# Patient Record
Sex: Female | Born: 1975 | Hispanic: Yes | Marital: Married | Smoking: Never smoker
Health system: Southern US, Community
[De-identification: ages and names within clinical notes are randomized; demographics above are authoritative.]

---

## 2010-02-20 ENCOUNTER — Ambulatory Visit: Payer: Self-pay | Admitting: Family Medicine

## 2010-03-15 ENCOUNTER — Ambulatory Visit: Payer: Self-pay | Admitting: Obstetrics and Gynecology

## 2014-02-05 ENCOUNTER — Inpatient Hospital Stay: Payer: Self-pay | Admitting: Specialist

## 2014-02-05 LAB — CBC WITH DIFFERENTIAL/PLATELET
BASOS PCT: 0.5 %
Basophil #: 0 10*3/uL (ref 0.0–0.1)
EOS ABS: 0.1 10*3/uL (ref 0.0–0.7)
EOS PCT: 1.1 %
HCT: 42.4 % (ref 35.0–47.0)
HGB: 13.5 g/dL (ref 12.0–16.0)
Lymphocyte #: 2.9 10*3/uL (ref 1.0–3.6)
Lymphocyte %: 31 %
MCH: 28.4 pg (ref 26.0–34.0)
MCHC: 31.8 g/dL — AB (ref 32.0–36.0)
MCV: 89 fL (ref 80–100)
MONO ABS: 0.5 x10 3/mm (ref 0.2–0.9)
MONOS PCT: 5.5 %
Neutrophil #: 5.7 10*3/uL (ref 1.4–6.5)
Neutrophil %: 61.9 %
Platelet: 170 10*3/uL (ref 150–440)
RBC: 4.74 10*6/uL (ref 3.80–5.20)
RDW: 13 % (ref 11.5–14.5)
WBC: 9.3 10*3/uL (ref 3.6–11.0)

## 2014-02-05 LAB — FIBRIN DEGRADATION PROD.(ARMC ONLY): Fibrin Degradation Prod.: <10 ug/ml (ref 2.1–7.7)

## 2014-02-05 LAB — BASIC METABOLIC PANEL
ANION GAP: 8 (ref 7–16)
BUN: 12 mg/dL (ref 7–18)
CHLORIDE: 109 mmol/L — AB (ref 98–107)
Calcium, Total: 8.4 mg/dL — ABNORMAL LOW (ref 8.5–10.1)
Co2: 25 mmol/L (ref 21–32)
Creatinine: 0.6 mg/dL (ref 0.60–1.30)
EGFR (African American): >60
GLUCOSE: 110 mg/dL — AB (ref 65–99)
OSMOLALITY: 284 (ref 275–301)
Potassium: 3.4 mmol/L — ABNORMAL LOW (ref 3.5–5.1)
Sodium: 142 mmol/L (ref 136–145)

## 2014-02-05 LAB — PROTIME-INR
INR: 1.9
Prothrombin Time: 21.5 secs — ABNORMAL HIGH (ref 11.5–14.7)

## 2014-02-05 LAB — APTT: Activated PTT: 35.9 secs (ref 23.6–35.9)

## 2014-02-06 LAB — BASIC METABOLIC PANEL
Anion Gap: 4 — ABNORMAL LOW (ref 7–16)
BUN: 11 mg/dL (ref 7–18)
CREATININE: 0.45 mg/dL — AB (ref 0.60–1.30)
Calcium, Total: 7.1 mg/dL — ABNORMAL LOW (ref 8.5–10.1)
Chloride: 116 mmol/L — ABNORMAL HIGH (ref 98–107)
Co2: 23 mmol/L (ref 21–32)
EGFR (African American): >60
EGFR (Non-African Amer.): >60
Glucose: 128 mg/dL — ABNORMAL HIGH (ref 65–99)
Osmolality: 286 (ref 275–301)
POTASSIUM: 4 mmol/L (ref 3.5–5.1)
Sodium: 143 mmol/L (ref 136–145)

## 2014-02-06 LAB — FIBRINOGEN: Fibrinogen: 237 mg/dL (ref 210–470)

## 2014-02-06 LAB — CBC WITH DIFFERENTIAL/PLATELET
BASOS ABS: 0 10*3/uL (ref 0.0–0.1)
Basophil #: 0 10*3/uL (ref 0.0–0.1)
Basophil %: 0.5 %
Basophil %: 0.4 %
EOS ABS: 0.1 10*3/uL (ref 0.0–0.7)
Eosinophil #: 0 10*3/uL (ref 0.0–0.7)
Eosinophil %: 0.5 %
Eosinophil %: 1.1 %
HCT: 33.9 % — AB (ref 35.0–47.0)
HCT: 35.9 % (ref 35.0–47.0)
HGB: 11.1 g/dL — ABNORMAL LOW (ref 12.0–16.0)
HGB: 11.6 g/dL — ABNORMAL LOW (ref 12.0–16.0)
Lymphocyte #: 1.3 10*3/uL (ref 1.0–3.6)
Lymphocyte #: 2.2 10*3/uL (ref 1.0–3.6)
Lymphocyte %: 19.3 %
Lymphocyte %: 31.5 %
MCH: 29.8 pg (ref 26.0–34.0)
MCH: 29.2 pg (ref 26.0–34.0)
MCHC: 32.8 g/dL (ref 32.0–36.0)
MCHC: 32.3 g/dL (ref 32.0–36.0)
MCV: 91 fL (ref 80–100)
MCV: 90 fL (ref 80–100)
MONO ABS: 0.4 x10 3/mm (ref 0.2–0.9)
MONOS PCT: 4.4 %
Monocyte #: 0.3 x10 3/mm (ref 0.2–0.9)
Monocyte %: 6.5 %
NEUTROS PCT: 75.3 %
NEUTROS PCT: 60.5 %
Neutrophil #: 5 10*3/uL (ref 1.4–6.5)
Neutrophil #: 4.1 10*3/uL (ref 1.4–6.5)
Platelet: 145 10*3/uL — ABNORMAL LOW (ref 150–440)
Platelet: 138 10*3/uL — ABNORMAL LOW (ref 150–440)
RBC: 3.72 10*6/uL — AB (ref 3.80–5.20)
RBC: 3.97 10*6/uL (ref 3.80–5.20)
RDW: 13.3 % (ref 11.5–14.5)
RDW: 13.4 % (ref 11.5–14.5)
WBC: 6.6 10*3/uL (ref 3.6–11.0)
WBC: 6.8 10*3/uL (ref 3.6–11.0)

## 2014-02-06 LAB — PROTIME-INR
INR: 1.2
Prothrombin Time: 14.8 secs — ABNORMAL HIGH (ref 11.5–14.7)

## 2014-02-07 LAB — CBC WITH DIFFERENTIAL/PLATELET
BASOS ABS: 0 10*3/uL (ref 0.0–0.1)
Basophil %: 0.7 %
EOS ABS: 0.1 10*3/uL (ref 0.0–0.7)
Eosinophil %: 1.6 %
HCT: 37.3 % (ref 35.0–47.0)
HGB: 12.3 g/dL (ref 12.0–16.0)
Lymphocyte #: 2.3 10*3/uL (ref 1.0–3.6)
Lymphocyte %: 37.7 %
MCH: 29.7 pg (ref 26.0–34.0)
MCHC: 32.9 g/dL (ref 32.0–36.0)
MCV: 90 fL (ref 80–100)
MONOS PCT: 5.7 %
Monocyte #: 0.3 x10 3/mm (ref 0.2–0.9)
NEUTROS ABS: 3.3 10*3/uL (ref 1.4–6.5)
Neutrophil %: 54.3 %
Platelet: 146 10*3/uL — ABNORMAL LOW (ref 150–440)
RBC: 4.14 10*6/uL (ref 3.80–5.20)
RDW: 13.1 % (ref 11.5–14.5)
WBC: 6 10*3/uL (ref 3.6–11.0)

## 2014-02-07 LAB — BASIC METABOLIC PANEL
Anion Gap: 6 — ABNORMAL LOW (ref 7–16)
BUN: 4 mg/dL — ABNORMAL LOW (ref 7–18)
Calcium, Total: 7.5 mg/dL — ABNORMAL LOW (ref 8.5–10.1)
Chloride: 114 mmol/L — ABNORMAL HIGH (ref 98–107)
Co2: 24 mmol/L (ref 21–32)
Creatinine: 0.45 mg/dL — ABNORMAL LOW (ref 0.60–1.30)
EGFR (African American): >60
Glucose: 84 mg/dL (ref 65–99)
Osmolality: 283 (ref 275–301)
POTASSIUM: 3.9 mmol/L (ref 3.5–5.1)
Sodium: 144 mmol/L (ref 136–145)

## 2014-08-30 NOTE — Discharge Summary (Signed)
PATIENT NAME:  Amanda Greene, Amanda Greene MR#:  [id number] DATE OF BIRTH:  June 05, 1975  DATE OF ADMISSION:  02/05/2014 DATE OF DISCHARGE:  02/08/2014   For a detailed note, please take a look at the history and physical done on admission by Dr. Luberta MutterKonidena.   DIAGNOSES AT DISCHARGE: Copperhead snake bite; localized inflammation secondary to the snake bite.   DIET: The patient is being discharged on a regular diet.   ACTIVITY: As tolerated.   FOLLOWUP: With Dr. Hillery AldoSarah Patel in the next 1-2 weeks.   DISCHARGE MEDICATIONS: Ibuprofen 600 mg 4 times daily as needed.   PERTINENT STUDIES DONE DURING THE HOSPITAL COURSE: None.   BRIEF HOSPITAL COURSE: This is a [age] year old female who presented to the hospital due to a copperhead snake bite to her right foot.  The patient had significant localized inflammation after the snakebite. She has some swelling, redness and inflammation tracking upper her right lower extremity. When she presented, her liver function tests and coagulopathy were fairly normal, although she was still given antivenom. She received 2 doses antivenom while in the hospital. Her pain and localized swelling and redness have since then significantly improved. She still has some trouble bearing some weight on that leg. She is being discharged with some crutches and some ibuprofen as needed for pain.   CODE STATUS: The patient is a full code.   TIME SPENT ON DISCHARGE: 35 minutes    ____________________________ Rolly PancakeVivek J. Cherlynn KaiserSainani, MD vjs:MT D: 02/08/2014 13:48:43 ET T: 02/09/2014 06:26:32 ET JOB#: [id number]  cc: Rolly PancakeVivek J. Cherlynn KaiserSainani, MD, <Dictator> Sarah "Sallie" Allena KatzPatel, MD [place] SirenVIVEK J SAINANI MD ELECTRONICALLY SIGNED 02/18/2014 14:59

## 2014-08-30 NOTE — H&P (Signed)
PATIENT NAME:  Amanda Greene, Amanda Greene MR#:  [id number] DATE OF BIRTH:  Nov 13, 1975  DATE OF ADMISSION:  02/05/2014  CHIEF COMPLAINT: Snake bite.   HISTORY OF PRESENT ILLNESS: A 39 year old female with no past medical history, had a snake bite this evening. The patient had a copperhead snakebite on the right ankle area, also associated with redness, swelling and pain. The patient came to the Emergency Room immediately. Does not have any other complaints at this time. No nausea. No vomiting. No rash. No diarrhea. No chest pain. No trouble breathing. No swelling of the lips. The patient does not have any dizziness, no paresthesias. Mental status, alert and oriented at this time   PAST MEDICAL HISTORY: No hypertension or diabetes.   ALLERGIES: No known allergies.   SOCIAL HISTORY: No smoking, no drinking,   PAST SURGICAL HISTORY: None.   MEDICATIONS: None.   FAMILY HISTORY: No hypertension or diabetes.   REVIEW OF SYSTEMS:  CONSTITUTIONAL: No fatigue.  EYES: No blurred vision.  EARS, NOSE AND THROAT: No tinnitus. No epistaxis. No difficulty swallowing. RESPIRATORY: No cough. No trouble breathing.  CARDIOVASCULAR: No chest pain, orthopnea. No PND.  GASTROINTESTINAL: No nausea. No vomiting. No abdominal pain.  GENITOURINARY: No dysuria.  ENDOCRINE: No polyuria or nocturia.  HEMATOLOGIC: No anemia or easy bruising.  SKIN: Does have a spot of redness, tenderness and bite mark on the left ankle area.  NEUROLOGIC: No numbness or weakness. No tremors. No paresthesia.  PSYCHIATRIC: Anxiety and insomnia.   PHYSICAL EXAMINATION:  VITAL SIGNS: Temperature 97.7, heart rate is 93, blood pressure 130/90, saturation 99% on room air. GENERAL: Alert, awake, oriented. The patient is in distress because of the pain due to snake bite.  HEAD: Normocephalic, atraumatic.  EYES: Pupils equal, reacting to light. Extraocular movements are intact.  EARS, NOSE AND THROAT: No tympanic membrane congestion. No  turbinate hypertrophy. No oropharyngeal erythema.  NECK: Supple. No JVD. No carotid bruit. LUNGS: Respirations clear to auscultation. No wheezes, no rales. CARDIOVASCULAR: S1, S2 regular. No murmurs. PMI not displaced. No peripheral edema.  GASTROINTESTINAL: Abdomen is soft, nontender, nondistended. Bowel sounds present.  MUSCULOSKELETAL: The patient is able to move all extremities and tenderness, redness and area of bite mark present on the right ankle area. NEUROLOGIC: Cranial nerves II-XII are intact. Power 5/5 in upper and lower extremities. Sensation intact. DTRs 2+ bilaterally.  PSYCHIATRIC: Mood and affect are within normal limits.   LABORATORY DATA: White count 9.3, hemoglobin 13.5, hematocrit 42.4, platelets 170,000. Electrolytes: Sodium 142, potassium 3.4, chloride 109, bicarbonate 25, BUN 12, creatinine 0.6, glucose 110. The patient's INR is 1.9, activated PTT 35.9. The patient's EKG, EKG was not done in the ER.   ASSESSMENT AND PLAN: The patient is a 39 year old female with copperhead snake bite. The patient does have swelling, redness and pain of the snake bite area. The patient received 4 vials of antivenom in the Emergency Room and iv  fluid were  already given. She received tetanus toxoid injection and also morphine was given for pain control. She received 8 mg of morphine so far. Admit her to medical service and the patient at this time is hemodynamically stable, and she will get pain control with Dilaudid 2 mg every 4 hours along with IV fluids and recheck her coagulation panel with (LFS , platelets and INR in 3 hours. I have elevated the leg. The patient's condition was discussed with [facility], they recommended that the patient's need for further dose of antivenom only if  her swelling is worse or the pain is worse. At that time, she can get 2 vials of antivenom. If the pain is better and the swelling gets better, the patient does not need anymore of antivenom, and they also  recommended complete bedrest. We will check CK tomorrow morning and also look for other signs of rhabdomyolysis. If the patient complains of a lot of pain, look for any compartment syndrome. The patient's condition at this time is stable and we will admit her to telemetry at this time.   The patient needs swelling,compartmeasurements  measurements every hour for 3 hours and thereafter every 2-4 hours at the site. [facility] she needs additional Cro[employer], that is antivenom, based on swelling and pain.  TIME SPENT: Sixty minutes.   ____________________________ Katha HammingSnehalatha Searcy Miyoshi, MD sk:TT D: 02/05/2014 19:35:46 ET T: 02/05/2014 20:30:38 ET JOB#: [id number]  cc: Katha HammingSnehalatha Alen Matheson, MD, <Dictator> Katha HammingSNEHALATHA Dalayza Zambrana MD ELECTRONICALLY SIGNED 03/01/2014 18:43

## 2020-06-30 ENCOUNTER — Ambulatory Visit
Admission: RE | Admit: 2020-06-30 | Discharge: 2020-06-30 | Disposition: A | Discharge status: Home / Self Care | Payer: Self-pay | Source: Ambulatory Visit | Attending: Oncology | Admitting: Oncology

## 2020-06-30 ENCOUNTER — Ambulatory Visit: Payer: Self-pay | Attending: Oncology

## 2020-06-30 ENCOUNTER — Other Ambulatory Visit: Payer: Self-pay

## 2020-06-30 VITALS — BP 110/65 | HR 57 | Temp 98.9°F | Ht 59.0 in | Wt 122.2 lb

## 2020-06-30 DIAGNOSIS — Z0000 Encounter for general adult medical examination without abnormal findings: Secondary | ICD-10-CM | POA: Insufficient documentation

## 2020-06-30 NOTE — Progress Notes (Signed)
  Subjective:     Patient ID: Amanda Greene, female   DOB: [birth year], 45 y.o.   MRN: [id number]  HPI   Review of Systems     Objective:   Physical Exam Chest:  Breasts:     Right: No swelling, bleeding, inverted nipple, mass, nipple discharge, skin change or tenderness.     Left: No swelling, bleeding, inverted nipple, mass, nipple discharge, skin change or tenderness.          Assessment:     45 year old Hispanic patient presents for [facility] clinic visit.  Patient screened, and meets [facility] eligibility.  Patient does not have insurance, Medicare or Medicaid.  Instructed patient on breast self awareness using teach back method.  Clinical breast exam unremarkable.  No mass or lump palpated.  Delos Haring interpreted exam.  Risk Assessment    Risk Scores      06/30/2020   Last edited by: Jim Like, RN   5-year risk: 0.4 %   Lifetime risk: 6.9 %            Plan:     Sent for bilateral screening mammogram.

## 2020-09-25 NOTE — Progress Notes (Signed)
Letter mailed from [facility] to notify of normal mammogram results.  Patient to return in one year for annual screening.  Copy to [facility]. 

## 2021-12-16 IMAGING — MG MM DIGITAL SCREENING BILAT W/ TOMO AND CAD
6 of 10 series · 6 of 30 positions shown · non-contrast
Comparison: None.

CLINICAL DATA: Screening.

EXAM:
DIGITAL SCREENING BILATERAL MAMMOGRAM WITH TOMOSYNTHESIS AND CAD
TECHNIQUE: Bilateral screening digital craniocaudal and mediolateral oblique
mammograms were obtained. Bilateral screening digital breast
tomosynthesis was performed. The images were evaluated with
computer-aided detection.

[R CC synth-2D]
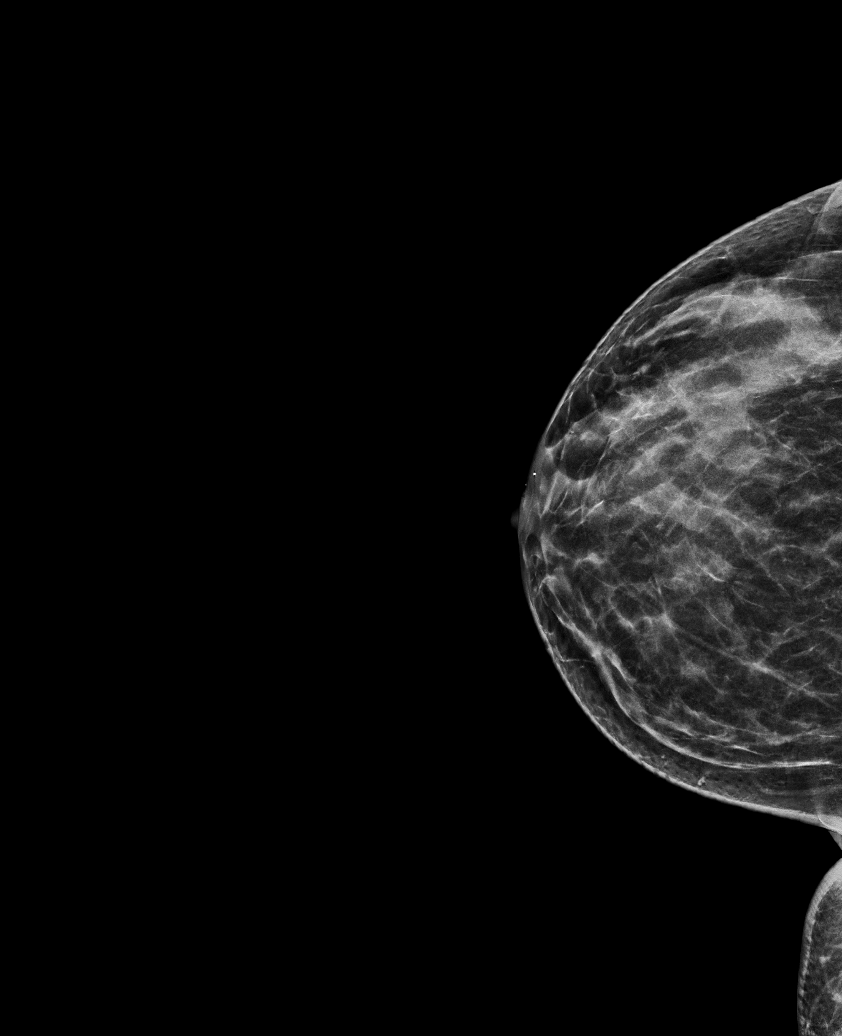

[R MLO synth-2D]
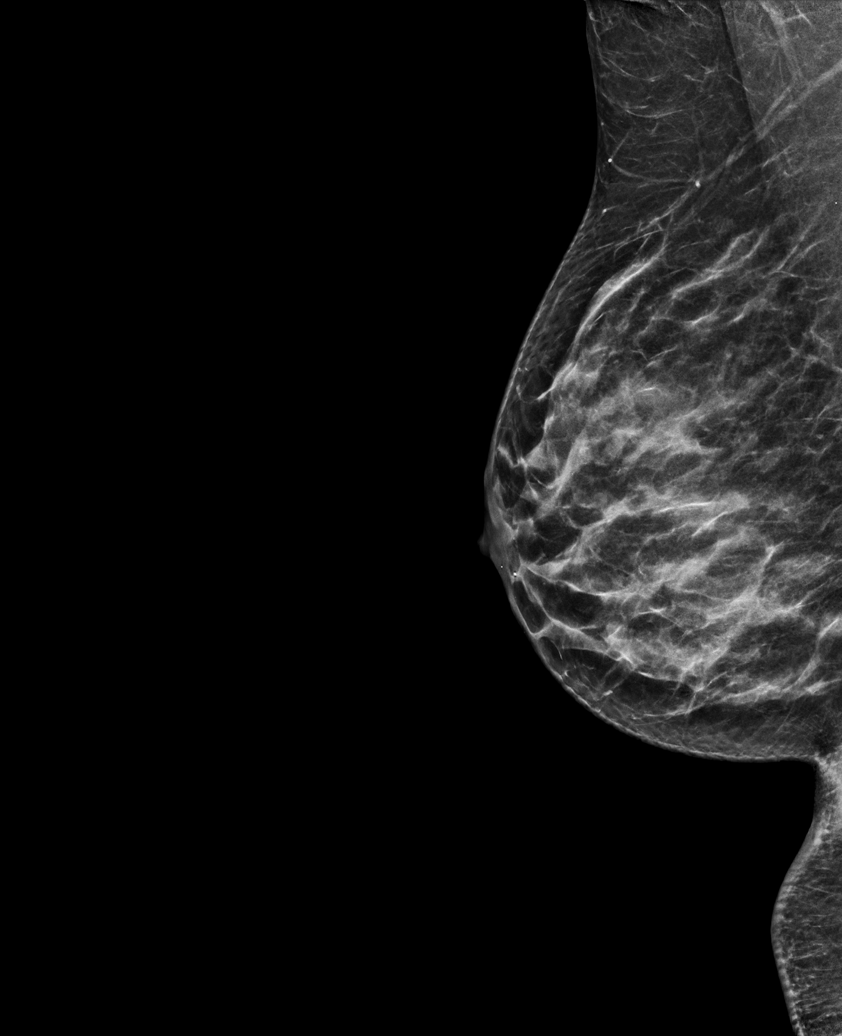

[L CC synth-2D (1 of 2)]
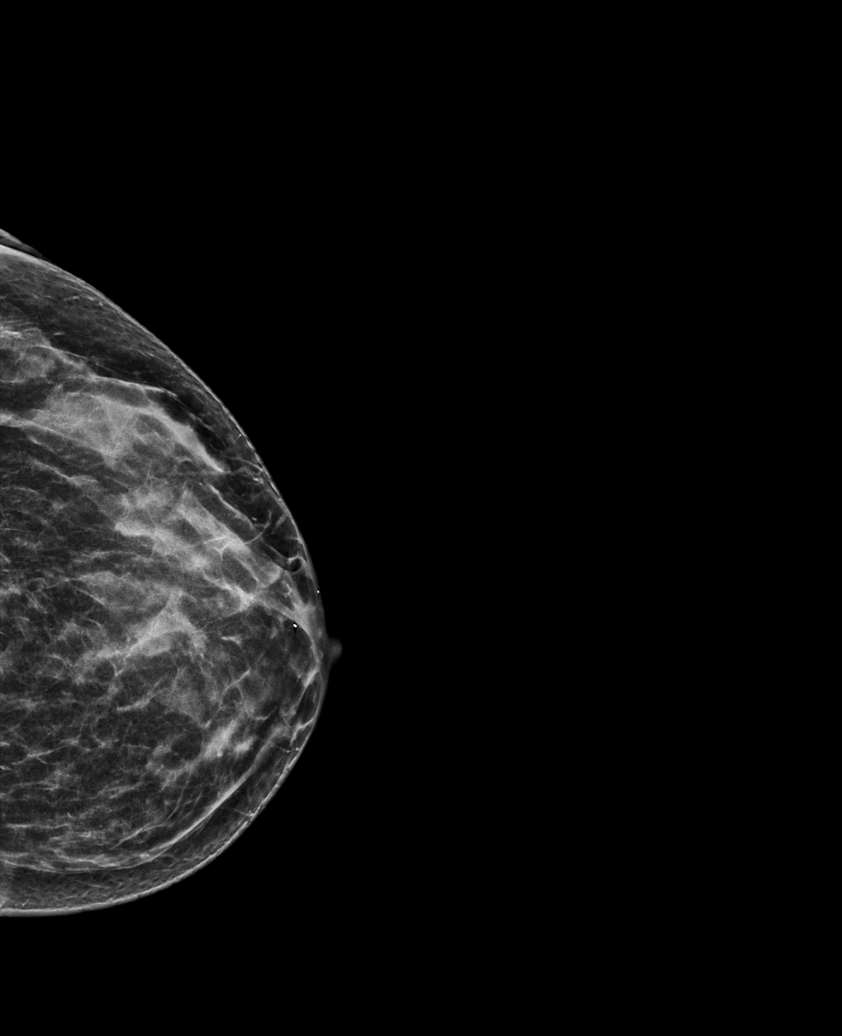

[L CC synth-2D (2 of 2)]
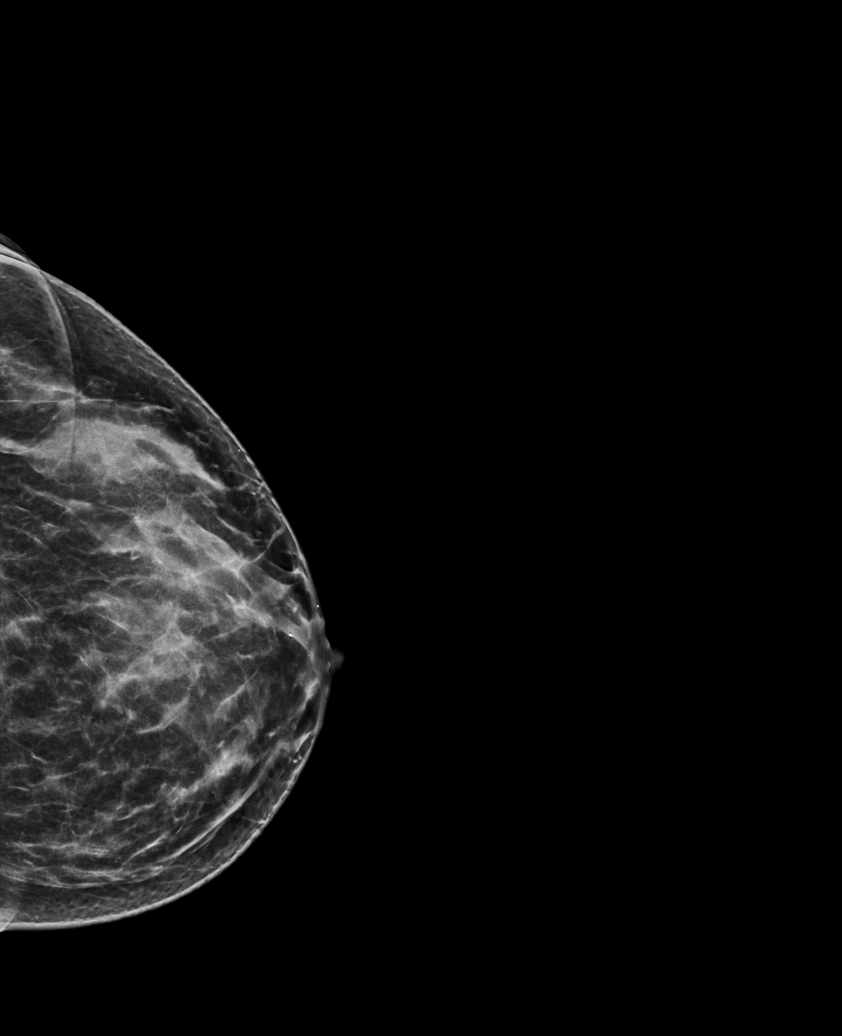

[L MLO synth-2D]
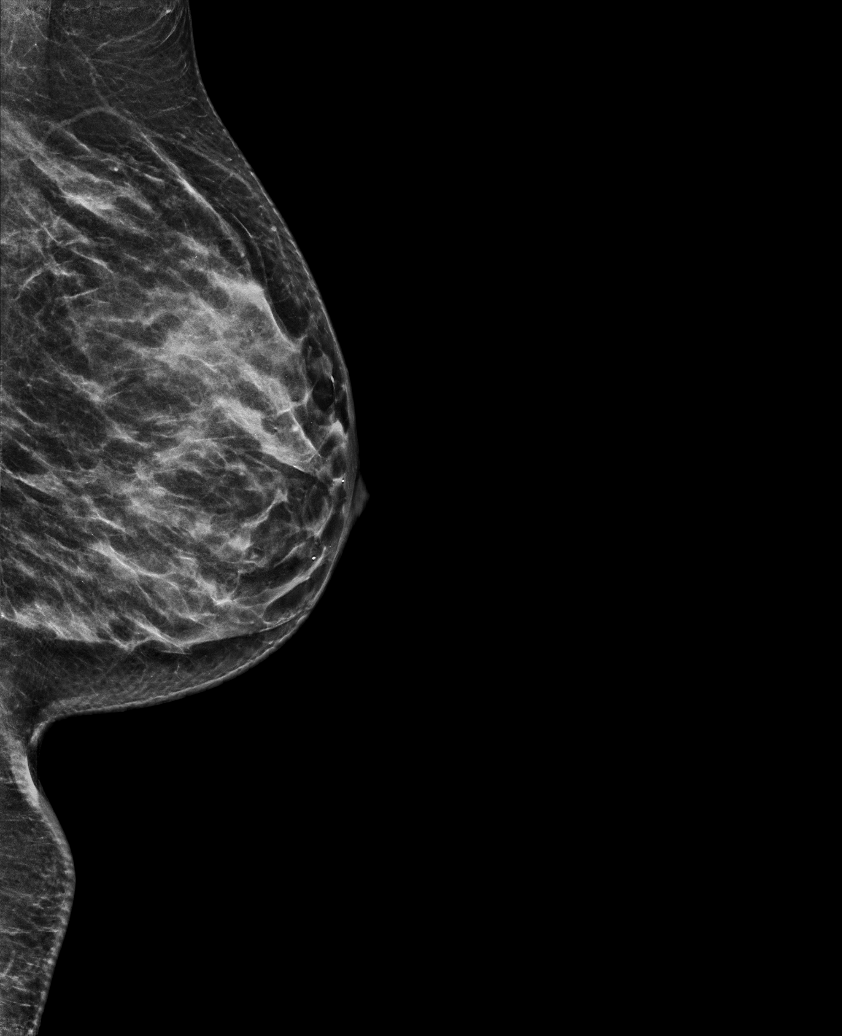

[R CC tomo · tomo slice 26/51.0]
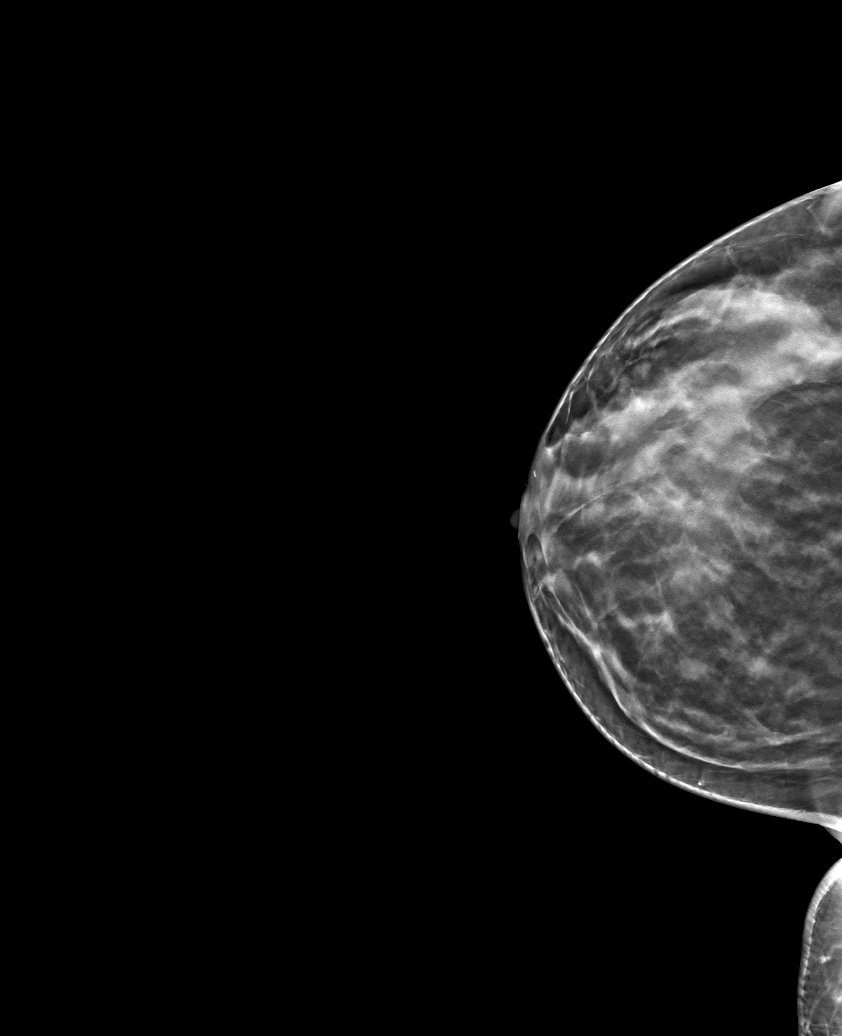

[6 of 30 positions shown; findings below may reference images not displayed]

Baseline exam.

ACR Breast Density Category c: The breast tissue is heterogeneously
dense, which may obscure small masses
FINDINGS: There are no findings suspicious for malignancy.
IMPRESSION: No mammographic evidence of malignancy. A result letter of this
screening mammogram will be mailed directly to the patient.

RECOMMENDATION:
Screening mammogram in one year. (Code:TH-S-B00)

BI-RADS CATEGORY  1: Negative.

## 2022-05-25 ENCOUNTER — Encounter: Payer: Self-pay | Admitting: Family Medicine

## 2022-06-01 ENCOUNTER — Other Ambulatory Visit: Payer: Self-pay

## 2022-06-01 DIAGNOSIS — Z1231 Encounter for screening mammogram for malignant neoplasm of breast: Secondary | ICD-10-CM

## 2022-07-04 ENCOUNTER — Ambulatory Visit
Admission: RE | Admit: 2022-07-04 | Discharge: 2022-07-04 | Disposition: A | Discharge status: Home / Self Care | Payer: Self-pay | Source: Ambulatory Visit | Attending: Obstetrics and Gynecology | Admitting: Obstetrics and Gynecology

## 2022-07-04 ENCOUNTER — Ambulatory Visit: Payer: Self-pay | Attending: Hematology and Oncology | Admitting: Hematology and Oncology

## 2022-07-04 VITALS — BP 127/78 | Wt 124.5 lb

## 2022-07-04 DIAGNOSIS — Z1231 Encounter for screening mammogram for malignant neoplasm of breast: Secondary | ICD-10-CM

## 2022-07-04 NOTE — Progress Notes (Signed)
Amanda Greene is a 47 y.o. female who presents to [facility] clinic today with no complaints.    Pap Smear: Pap not smear completed today. Last Pap smear was 2022 at [facility] and was normal. Per patient has no history of an abnormal Pap smear. Last Pap smear result is available in Epic.   Physical exam: Breasts Breasts symmetrical. No skin abnormalities bilateral breasts. No nipple retraction bilateral breasts. No nipple discharge bilateral breasts. No lymphadenopathy. No lumps palpated bilateral breasts.  MS DIGITAL SCREENING TOMO BILATERAL  Result Date: 07/03/2020 CLINICAL DATA:  Screening. EXAM: DIGITAL SCREENING BILATERAL MAMMOGRAM WITH TOMOSYNTHESIS AND CAD TECHNIQUE: Bilateral screening digital craniocaudal and mediolateral oblique mammograms were obtained. Bilateral screening digital breast tomosynthesis was performed. The images were evaluated with computer-aided detection. COMPARISON:  None.  Baseline exam. ACR Breast Density Category c: The breast tissue is heterogeneously dense, which may obscure small masses FINDINGS: There are no findings suspicious for malignancy. IMPRESSION: No mammographic evidence of malignancy. A result letter of this screening mammogram will be mailed directly to the patient. RECOMMENDATION: Screening mammogram in one year. (Code:SM-B-01Y) BI-RADS CATEGORY  1: Negative. Electronically Signed   By: Valentino Saxon MD   On: 07/03/2020 13:41         Pelvic/Bimanual Pap is not indicated today    Smoking History: Patient has never smoked and was not referred to quit line.    Patient Navigation: Patient education provided. Access to services provided for patient through [place] interpreter provided. No transportation provided   Colorectal Cancer Screening: Per patient has never had colonoscopy completed No complaints today.    Breast and Cervical Cancer Risk Assessment: Patient does not have family history of breast cancer,  known genetic mutations, or radiation treatment to the chest before age 85. Patient does not have history of cervical dysplasia, immunocompromised, or DES exposure in-utero.  Risk Assessment   No risk assessment data for the current encounter  Risk Scores       06/30/2020   Last edited by: Rico Junker, RN   5-year risk: 0.4 %   Lifetime risk: 6.9 %            A: [facility] exam without pap smear No complaints with benign exam.   P: Referred patient to the [facility] for a screening mammogram. Appointment scheduled 07/04/22.  Melodye Ped, NP 07/04/2022 10:44 AM

## 2022-07-04 NOTE — Patient Instructions (Signed)
Taught Amanda Greene about self breast awareness and gave educational materials to take home. Patient did not need a Pap smear today due to last Pap smear was in 2022 per patient. Let her know [facility] will cover Pap smears every 5 years unless has a history of abnormal Pap smears. [facility] Appointment scheduled for 07/04/22. Patient aware of appointment and will be there. Let patient know will follow up with her within the next couple weeks with results. Amanda Greene verbalized understanding.  Melodye Ped, NP 10:44 AM

## 2023-05-24 ENCOUNTER — Encounter: Payer: Self-pay | Admitting: Family Medicine

## 2023-06-12 ENCOUNTER — Other Ambulatory Visit: Payer: Self-pay | Admitting: Family Medicine

## 2023-06-12 DIAGNOSIS — Z1231 Encounter for screening mammogram for malignant neoplasm of breast: Secondary | ICD-10-CM

## 2023-07-06 ENCOUNTER — Ambulatory Visit
Admission: RE | Admit: 2023-07-06 | Discharge: 2023-07-06 | Disposition: A | Discharge status: Home / Self Care | Payer: Self-pay | Source: Ambulatory Visit | Attending: Family Medicine | Admitting: Family Medicine

## 2023-07-06 DIAGNOSIS — Z1231 Encounter for screening mammogram for malignant neoplasm of breast: Secondary | ICD-10-CM | POA: Insufficient documentation
